# Patient Record
Sex: Male | Born: 1985 | Race: White | Hispanic: No | Marital: Single | Smoking: Current every day smoker
Health system: Southern US, Community
[De-identification: ages and names within clinical notes are randomized; demographics above are authoritative.]

## PROBLEM LIST (undated history)

## (undated) DIAGNOSIS — F319 Bipolar disorder, unspecified: Secondary | ICD-10-CM

## (undated) DIAGNOSIS — S069X9A Unspecified intracranial injury with loss of consciousness of unspecified duration, initial encounter: Secondary | ICD-10-CM

## (undated) DIAGNOSIS — J9819 Other pulmonary collapse: Secondary | ICD-10-CM

## (undated) DIAGNOSIS — F410 Panic disorder [episodic paroxysmal anxiety] without agoraphobia: Secondary | ICD-10-CM

## (undated) DIAGNOSIS — S069XAA Unspecified intracranial injury with loss of consciousness status unknown, initial encounter: Secondary | ICD-10-CM

## (undated) HISTORY — PX: INNER EAR SURGERY: SHX679

## (undated) HISTORY — DX: Bipolar disorder, unspecified: F31.9

## (undated) HISTORY — PX: TOTAL HIP ARTHROPLASTY: SHX124

## (undated) HISTORY — DX: Other pulmonary collapse: J98.19

---
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
## 2007-02-10 ENCOUNTER — Emergency Department (HOSPITAL_COMMUNITY): Admission: EM | Admit: 2007-02-10 | Discharge: 2007-02-10 | Payer: Self-pay | Admitting: Family Medicine

## 2007-08-19 ENCOUNTER — Emergency Department (HOSPITAL_COMMUNITY): Admission: EM | Admit: 2007-08-19 | Discharge: 2007-08-19 | Payer: Self-pay | Admitting: Family Medicine

## 2007-09-14 ENCOUNTER — Emergency Department (HOSPITAL_COMMUNITY): Admission: EM | Admit: 2007-09-14 | Discharge: 2007-09-14 | Payer: Self-pay | Admitting: Family Medicine

## 2007-09-16 ENCOUNTER — Emergency Department (HOSPITAL_COMMUNITY): Admission: EM | Admit: 2007-09-16 | Discharge: 2007-09-16 | Payer: Self-pay | Admitting: Family Medicine

## 2007-11-29 ENCOUNTER — Ambulatory Visit: Payer: Self-pay | Admitting: Internal Medicine
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
## 2011-03-13 NOTE — Op Note (Signed)
   NAME:  [place], [place] E                           ACCOUNT NO.:  [id number]   MEDICAL RECORD NO.:  [id number]                   PATIENT TYPE:  INP   LOCATION:  3111                                 FACILITY:  [facility]   PHYSICIAN:  Loreta Ave, M.D.              DATE OF BIRTH:  1986/08/19   DATE OF PROCEDURE:  12/05/2002  DATE OF DISCHARGE:                                 OPERATIVE REPORT   PREOPERATIVE DIAGNOSIS:  Closed posterior dislocation, traumatic, right hip.   POSTOPERATIVE DIAGNOSIS:  Closed posterior dislocation, traumatic, right  hip.   PROCEDURE:  Closed reduction of right hip dislocation under general  anesthesia.   SURGEON:  Loreta Ave, M.D.   ANESTHESIA:  General.   DESCRIPTION OF PROCEDURE:  Patient brought to the operating room and after  adequate anesthesia had been obtained, the right hip was examined.  Shortening, flexion, and internal rotation.  With flexion, longitudinal  traction, and external rotation the hip was able to be reduced to a nice  stable congruent position without difficulty.  This slid into place very  comfortably without any crepitus.  At completion I had good stability,  reasonable smooth motion.  Leg lengths were equal with normal rotation.  C-  arm views were obtained showing congruent reduction on both AP and lateral  view.  Buck's traction and an abduction pillow placed.  Anesthesia reversed.  Brought to the recovery room.  Tolerated the surgery well with no  complications.                                               Loreta Ave, M.D.    DFM/MEDQ  D:  12/06/2002  T:  12/06/2002  Job:  [id number]
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
## 2011-03-13 NOTE — Op Note (Signed)
NAME:  Allen [name], Allen [name]                 ACCOUNT NO.:  [id number]   MEDICAL RECORD NO.:  [id number]          PATIENT TYPE:  AMB   LOCATION:  DSC                          FACILITY:  [facility]   PHYSICIAN:  Kathy Breach, M.D.      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   POSTOPERATIVE DIAGNOSIS:  Ossicular discontinuity, left ear.   OPERATION PERFORMED:  Exploratory tympanotomy with autogenous bone chip  ossicular reconstruction.   SURGEON:  Kathy Breach, M.D.   ANESTHESIA:  General orotracheal anesthesia.   DESCRIPTION OF PROCEDURE:  With the patient under general orotracheal  anesthesia, the left ear was prepped and draped in sterile fashion.  The  canal was infiltrated with 1% Xylocaine, 1:100,000 epinephrine for  vasoconstriction.  After seven to eight minutes by the clock, tympanotomy  flap was incised from 6 o'clock inferiorly and 12 o'clock superiorly.  Inspection of the tympanic membrane prior to incision revealed what was seen  in the office with interior 2 to 3 mm of the long process of the malleus was  unattached from the remainder of the long process of the malleus.  Tympanotomy flap was elevated and entering the middle ear space.  The course  of the chorda tympani identified a little bit more inferiorly than would  have been expected.  As it was dissected, it dissected out into the tympanic  membrane and disappeared.  It  was disrupted probably from the trauma.  Inspection of the ossicular chain had adhesion from the other side of the  tympanic membrane down to the lenticular process area.  This was released.  Manipulation of the remaining intact upper malleus handle showed good  movement of the incus but there was no movement of the stapes with  lenticular process area fractured  and fibrotic.  This was dissected clear  leaving long process of the incus hanging down to just over the stapes  capitulum with intact stapedius tendon and a remaining gap.  Bone chips were  taken by curette from the posterior superior canal wall.  Bone chip was  positioned on the stapes capitulum with contact with the remaining long  process of the incus and appeared stable in position.  Gelfoam pledgets  soaked with Ciprodex were packed circumferentially around the bone chip  position for stabilization.  Tympanotomy flap was turned back into position  and stabilized packing the canal with Gelfoam pledgets soaked with Ciprodex.  Sterile cotton was placed in the external meatus.  The patient tolerated the  procedure well and was taken to the recovery room in stable general  condition.       JGL/MEDQ  D:  07/21/2004  T:  07/21/2004  Job:  [id number]

## 2011-08-04 LAB — INFLUENZA A AND B ANTIGEN (CONVERTED LAB): Inflenza A Ag: NEGATIVE

## 2016-04-14 ENCOUNTER — Emergency Department (HOSPITAL_COMMUNITY)
Admission: EM | Admit: 2016-04-14 | Discharge: 2016-04-14 | Disposition: A | Discharge status: Home / Self Care | Payer: Self-pay | Attending: Emergency Medicine | Admitting: Emergency Medicine

## 2016-04-14 ENCOUNTER — Emergency Department (HOSPITAL_COMMUNITY): Payer: Self-pay

## 2016-04-14 ENCOUNTER — Encounter (HOSPITAL_COMMUNITY): Payer: Self-pay

## 2016-04-14 ENCOUNTER — Other Ambulatory Visit: Payer: Self-pay

## 2016-04-14 DIAGNOSIS — R0789 Other chest pain: Secondary | ICD-10-CM | POA: Insufficient documentation

## 2016-04-14 DIAGNOSIS — Z566 Other physical and mental strain related to work: Secondary | ICD-10-CM

## 2016-04-14 DIAGNOSIS — F1290 Cannabis use, unspecified, uncomplicated: Secondary | ICD-10-CM | POA: Insufficient documentation

## 2016-04-14 DIAGNOSIS — F419 Anxiety disorder, unspecified: Secondary | ICD-10-CM | POA: Insufficient documentation

## 2016-04-14 DIAGNOSIS — F17200 Nicotine dependence, unspecified, uncomplicated: Secondary | ICD-10-CM | POA: Insufficient documentation

## 2016-04-14 DIAGNOSIS — M47814 Spondylosis without myelopathy or radiculopathy, thoracic region: Secondary | ICD-10-CM | POA: Insufficient documentation

## 2016-04-14 DIAGNOSIS — E876 Hypokalemia: Secondary | ICD-10-CM | POA: Insufficient documentation

## 2016-04-14 DIAGNOSIS — Z563 Stressful work schedule: Secondary | ICD-10-CM | POA: Insufficient documentation

## 2016-04-14 DIAGNOSIS — Z791 Long term (current) use of non-steroidal anti-inflammatories (NSAID): Secondary | ICD-10-CM | POA: Insufficient documentation

## 2016-04-14 HISTORY — DX: Panic disorder (episodic paroxysmal anxiety): F41.0

## 2016-04-14 HISTORY — DX: Unspecified intracranial injury with loss of consciousness status unknown, initial encounter: S06.9XAA

## 2016-04-14 HISTORY — DX: Unspecified intracranial injury with loss of consciousness of unspecified duration, initial encounter: S06.9X9A

## 2016-04-14 LAB — CBC
HCT: 45.6 % (ref 39.0–52.0)
HEMOGLOBIN: 16 g/dL (ref 13.0–17.0)
MCH: 32.6 pg (ref 26.0–34.0)
MCHC: 35.1 g/dL (ref 30.0–36.0)
MCV: 92.9 fL (ref 78.0–100.0)
PLATELETS: 385 10*3/uL (ref 150–400)
RBC: 4.91 MIL/uL (ref 4.22–5.81)
RDW: 13.1 % (ref 11.5–15.5)
WBC: 9.2 10*3/uL (ref 4.0–10.5)

## 2016-04-14 LAB — BASIC METABOLIC PANEL
ANION GAP: 8 (ref 5–15)
BUN: 8 mg/dL (ref 6–20)
CALCIUM: 9.2 mg/dL (ref 8.9–10.3)
CHLORIDE: 105 mmol/L (ref 101–111)
CO2: 28 mmol/L (ref 22–32)
Creatinine, Ser: 0.74 mg/dL (ref 0.61–1.24)
GFR calc non Af Amer: >60 mL/min (ref >60)
Glucose, Bld: 92 mg/dL (ref 65–99)
Potassium: 3.3 mmol/L — ABNORMAL LOW (ref 3.5–5.1)
SODIUM: 141 mmol/L (ref 135–145)

## 2016-04-14 LAB — URINALYSIS, ROUTINE W REFLEX MICROSCOPIC
BILIRUBIN URINE: NEGATIVE
Glucose, UA: NEGATIVE mg/dL
HGB URINE DIPSTICK: NEGATIVE
Ketones, ur: NEGATIVE mg/dL
Leukocytes, UA: NEGATIVE
NITRITE: NEGATIVE
PROTEIN: NEGATIVE mg/dL
SPECIFIC GRAVITY, URINE: 1.007 (ref 1.005–1.030)
pH: 6.5 (ref 5.0–8.0)

## 2016-04-14 LAB — I-STAT TROPONIN, ED: TROPONIN I, POC: 0 ng/mL (ref 0.00–0.08)

## 2016-04-14 LAB — CK: CK TOTAL: 112 U/L (ref 49–397)

## 2016-04-14 MED ORDER — SODIUM CHLORIDE 0.9 % IV BOLUS (SEPSIS)
1000.0000 mL | Freq: Once | INTRAVENOUS | Status: AC
  Administered 2016-04-14: 1000 mL via INTRAVENOUS

## 2016-04-14 MED ORDER — POTASSIUM CHLORIDE CRYS ER 20 MEQ PO TBCR
40.0000 meq | EXTENDED_RELEASE_TABLET | Freq: Once | ORAL | Status: AC
  Administered 2016-04-14: 40 meq via ORAL
  Filled 2016-04-14: qty 2

## 2016-04-14 NOTE — Discharge Instructions (Signed)
Your labs, xray, and EKG are all reassuring, your chest pain could be a variety of things including muscle pain, gas pain, and other nonemergent issues. Alternate between tylenol and motrin as directed as needed for pain. Stay well hydrated. You may consider using heat to the areas of pain, no more than 20 minutes every hour. Your potassium level was slightly low, you were given potassium here so no further treatment should be needed, you should look at the list of foods below to see if there are any foods you could eat to help supplement your potassium intake every day. Follow up with [facility] in 1-2 weeks to establish care and for recheck of symptoms, and ongoing management of your anxiety/stress. Return to the ER for changes or worsening symptoms. ° °SEEK IMMEDIATE MEDICAL ATTENTION IF: °You develop a fever.  °Your chest pains become severe or intolerable.  °You develop new, unexplained symptoms (problems).  °You develop shortness of breath, nausea, vomiting, sweating or feel light headed.  °You develop a new cough or you cough up blood. °You develop new leg swelling ° ° °Chest Wall Pain °Chest wall pain is pain in or around the bones and muscles of your chest. Sometimes, an injury causes this pain. Sometimes, the cause may not be known. This pain may take several weeks or longer to get better. °HOME CARE °Pay attention to any changes in your symptoms. Take these actions to help with your pain: °· Rest as told by your doctor. °· Avoid activities that cause pain. Try not to use your chest, belly (abdominal), or side muscles to lift heavy things. °· If directed, apply ice to the painful area: °· Put ice in a plastic bag. °· Place a towel between your skin and the bag. °· Leave the ice on for 20 minutes, 2-3 times per day. °· Take over-the-counter and prescription medicines only as told by your doctor. °· Do not use tobacco products, including cigarettes, chewing tobacco, and e-cigarettes. If you  need help quitting, ask your doctor. °· Keep all follow-up visits as told by your doctor. This is important. °GET HELP IF: °· You have a fever. °· Your chest pain gets worse. °· You have new symptoms. °GET HELP RIGHT AWAY IF: °· You feel sick to your stomach (nauseous) or you throw up (vomit). °· You feel sweaty or light-headed. °· You have a cough with phlegm (sputum) or you cough up blood. °· You are short of breath. °  °This information is not intended to replace advice given to you by your health care provider. Make sure you discuss any questions you have with your health care provider. °  °Document Released: 03/30/2008 Document Revised: 07/03/2015 Document Reviewed: 01/07/2015 °Elsevier Interactive Patient Education ©2016 [employer] Inc[employer] ° °Hypokalemia °Hypokalemia means that the amount of potassium in the blood is lower than normal. Potassium is a chemical, called an electrolyte, that helps regulate the amount of fluid in the body. It also stimulates muscle contraction and helps nerves function properly. Most of the body's potassium is inside of cells, and only a very small amount is in the blood. Because the amount in the blood is so small, minor changes can be life-threatening. °CAUSES °· Antibiotics. °· Diarrhea or vomiting. °· Using laxatives too much, which can cause diarrhea. °· Chronic kidney disease. °· Water pills (diuretics). °· Eating disorders (bulimia). °· Low magnesium level. °· Sweating a lot. °SIGNS AND SYMPTOMS °· Weakness. °· Constipation. °· Fatigue. °· Muscle cramps. °· Mental confusion. °·   Skipped heartbeats or irregular heartbeat (palpitations). °· Tingling or numbness. °DIAGNOSIS  °Your health care provider can diagnose hypokalemia with blood tests. In addition to checking your potassium level, your health care provider may also check other lab tests. °TREATMENT °Hypokalemia can be treated with potassium supplements taken by mouth or adjustments in your current medicines. If your potassium  level is very low, you may need to get potassium through a vein (IV) and be monitored in the hospital. A diet high in potassium is also helpful. Foods high in potassium are: °· Nuts, such as peanuts and pistachios. °· Seeds, such as sunflower seeds and pumpkin seeds. °· Peas, lentils, and lima beans. °· Whole grain and bran cereals and breads. °· Fresh fruit and vegetables, such as apricots, avocado, bananas, cantaloupe, kiwi, oranges, tomatoes, asparagus, and potatoes. °· Orange and tomato juices. °· Red meats. °· Fruit yogurt. °HOME CARE INSTRUCTIONS °· Take all medicines as prescribed by your health care provider. °· Maintain a healthy diet by including nutritious food, such as fruits, vegetables, nuts, whole grains, and lean meats. °· If you are taking a laxative, be sure to follow the directions on the label. °SEEK MEDICAL CARE IF: °· Your weakness gets worse. °· You feel your heart pounding or racing. °· You are vomiting or having diarrhea. °· You are diabetic and having trouble keeping your blood glucose in the normal range. °SEEK IMMEDIATE MEDICAL CARE IF: °· You have chest pain, shortness of breath, or dizziness. °· You are vomiting or having diarrhea for more than 2 days. °· You faint. °MAKE SURE YOU:  °· Understand these instructions. °· Will watch your condition. °· Will get help right away if you are not doing well or get worse. °  °This information is not intended to replace advice given to you by your health care provider. Make sure you discuss any questions you have with your health care provider. °  °Document Released: 10/12/2005 Document Revised: 11/02/2014 Document Reviewed: 04/14/2013 °Elsevier Interactive Patient Education ©2016 Else[employer] Inc. ° °Potassium Content of Foods °Potassium is a mineral found in many foods and drinks. It helps keep fluids and minerals balanced in your body and affects how steadily your heart beats. Potassium also helps control your blood pressure and keep your muscles  and nervous system healthy. °Certain health conditions and medicines may change the balance of potassium in your body. When this happens, you can help balance your level of potassium through the foods that you do or do not eat. Your health care provider or dietitian may recommend an amount of potassium that you should have each day. The following lists of foods provide the amount of potassium (in parentheses) per serving in each item. °HIGH IN POTASSIUM  °The following foods and beverages have 200 mg or more of potassium per serving: °· Apricots, 2 raw or 5 dry (200 mg). °· Artichoke, 1 medium (345 mg). °· Avocado, raw,  ¼ each (245 mg). °· Banana, 1 medium (425 mg). °· Beans, lima, or baked beans, canned, ½ cup (280 mg). °· Beans, white, canned, ½ cup (595 mg). °· Beef roast, 3 oz (320 mg). °· Beef, ground, 3 oz (270 mg). °· Beets, raw or cooked, ½ cup (260 mg). °· Bran muffin, 2 oz (300 mg). °· Broccoli, ½ cup (230 mg). °· Brussels sprouts, ½ cup (250 mg). °· Cantaloupe, ½ cup (215 mg). °· Cereal, 100% bran, ½ cup (200-400 mg). °· Cheeseburger, single, fast food, 1 each (225-400 mg). °· Chicken, 3 oz (220   mg).  Clams, canned, 3 oz (535 mg).  Crab, 3 oz (225 mg).  Dates, 5 each (270 mg).  Dried beans and peas,  cup (300-475 mg).  Figs, dried, 2 each (260 mg).  Fish: halibut, tuna, cod, snapper, 3 oz (480 mg).  Fish: salmon, haddock, swordfish, perch, 3 oz (300 mg).  Fish, tuna, canned 3 oz (200 mg).  [place] fries, fast food, 3 oz (470 mg).  Granola with fruit and nuts,  cup (200 mg).  Grapefruit juice,  cup (200 mg).  Greens, beet,  cup (655 mg).  Honeydew melon,  cup (200 mg).  Kale, raw, 1 cup (300 mg).  Kiwi, 1 medium (240 mg).  Kohlrabi, rutabaga, parsnips,  cup (280 mg).  Lentils,  cup (365 mg).  Mango, 1 each (325 mg).  Milk, chocolate, 1 cup (420 mg).  Milk: nonfat, low-fat, whole, buttermilk, 1 cup (350-380 mg).  Molasses, 1 Tbsp (295 mg).  Mushrooms,  cup  (280) mg.  Nectarine, 1 each (275 mg).  Nuts: almonds, peanuts, hazelnuts, [place], cashew, mixed, 1 oz (200 mg).  Nuts, pistachios, 1 oz (295 mg).  Orange, 1 each (240 mg).  Orange juice,  cup (235 mg).  Papaya, medium,  fruit (390 mg).  Peanut butter, chunky, 2 Tbsp (240 mg).  Peanut butter, smooth, 2 Tbsp (210 mg).  Pear, 1 medium (200 mg).  Pomegranate, 1 whole (400 mg).  Pomegranate juice,  cup (215 mg).  Pork, 3 oz (350 mg).  Potato chips, salted, 1 oz (465 mg).  Potato, baked with skin, 1 medium (925 mg).  Potatoes, boiled,  cup (255 mg).  Potatoes, mashed,  cup (330 mg).  Prune juice,  cup (370 mg).  Prunes, 5 each (305 mg).  Pudding, chocolate,  cup (230 mg).  Pumpkin, canned,  cup (250 mg).  Raisins, seedless,  cup (270 mg).  Seeds, sunflower or pumpkin, 1 oz (240 mg).  Soy milk, 1 cup (300 mg).  Spinach,  cup (420 mg).  Spinach, canned,  cup (370 mg).  Sweet potato, baked with skin, 1 medium (450 mg).  Swiss chard,  cup (480 mg).  Tomato or vegetable juice,  cup (275 mg).  Tomato sauce or puree,  cup (400-550 mg).  Tomato, raw, 1 medium (290 mg).  Tomatoes, canned,  cup (200-300 mg).  [place], 3 oz (250 mg).  Wheat germ, 1 oz (250 mg).  Winter squash,  cup (250 mg).  Yogurt, plain or fruited, 6 oz (260-435 mg).  Zucchini,  cup (220 mg). MODERATE IN POTASSIUM The following foods and beverages have 50-200 mg of potassium per serving:  Apple, 1 each (150 mg).  Apple juice,  cup (150 mg).  Applesauce,  cup (90 mg).  Apricot nectar,  cup (140 mg).  Asparagus, small spears,  cup or 6 spears (155 mg).  Bagel, cinnamon raisin, 1 each (130 mg).  Bagel, egg or plain, 4 in., 1 each (70 mg).  Beans, green,  cup (90 mg).  Beans, yellow,  cup (190 mg).  Beer, regular, 12 oz (100 mg).  Beets, canned,  cup (125 mg).  Blackberries,  cup (115 mg).  Blueberries,  cup (60 mg).  Bread, whole wheat, 1  slice (70 mg).  Broccoli, raw,  cup (145 mg).  Cabbage,  cup (150 mg).  Carrots, cooked or raw,  cup (180 mg).  Cauliflower, raw,  cup (150 mg).  Celery, raw,  cup (155 mg).  Cereal, bran flakes, cup (120-150 mg).  Cheese, cottage,  cup (110  mg).  Cherries, 10 each (150 mg).  Chocolate, 1 oz bar (165 mg).  Coffee, brewed 6 oz (90 mg).  Corn,  cup or 1 ear (195 mg).  Cucumbers,  cup (80 mg).  Egg, large, 1 each (60 mg).  Eggplant,  cup (60 mg).  Endive, raw, cup (80 mg).  English muffin, 1 each (65 mg).  Fish, orange roughy, 3 oz (150 mg).  Frankfurter, beef or pork, 1 each (75 mg).  Fruit cocktail,  cup (115 mg).  Grape juice,  cup (170 mg).  Grapefruit,  fruit (175 mg).  Grapes,  cup (155 mg).  Greens: kale, turnip, collard,  cup (110-150 mg).  Ice cream or frozen yogurt, chocolate,  cup (175 mg).  Ice cream or frozen yogurt, vanilla,  cup (120-150 mg).  Lemons, limes, 1 each (80 mg).  Lettuce, all types, 1 cup (100 mg).  Mixed vegetables,  cup (150 mg).  Mushrooms, raw,  cup (110 mg).  Nuts: walnuts, pecans, or macadamia, 1 oz (125 mg).  Oatmeal,  cup (80 mg).  Okra,  cup (110 mg).  Onions, raw,  cup (120 mg).  Peach, 1 each (185 mg).  Peaches, canned,  cup (120 mg).  Pears, canned,  cup (120 mg).  Peas, green, frozen,  cup (90 mg).  Peppers, green,  cup (130 mg).  Peppers, red,  cup (160 mg).  Pineapple juice,  cup (165 mg).  Pineapple, fresh or canned,  cup (100 mg).  Plums, 1 each (105 mg).  Pudding, vanilla,  cup (150 mg).  Raspberries,  cup (90 mg).  Rhubarb,  cup (115 mg).  Rice, wild,  cup (80 mg).  Shrimp, 3 oz (155 mg).  Spinach, raw, 1 cup (170 mg).  Strawberries,  cup (125 mg).  Summer squash  cup (175-200 mg).  Swiss chard, raw, 1 cup (135 mg).  Tangerines, 1 each (140 mg).  Tea, brewed, 6 oz (65 mg).  Turnips,  cup (140 mg).  Watermelon,  cup (85  mg).  Wine, red, table, 5 oz (180 mg).  Wine, white, table, 5 oz (100 mg). LOW IN POTASSIUM The following foods and beverages have less than 50 mg of potassium per serving.  Bread, white, 1 slice (30 mg).  Carbonated beverages, 12 oz (less than 5 mg).  Cheese, 1 oz (20-30 mg).  Cranberries,  cup (45 mg).  Cranberry juice cocktail,  cup (20 mg).  Fats and oils, 1 Tbsp (less than 5 mg).  Hummus, 1 Tbsp (32 mg).  Nectar: papaya, mango, or pear,  cup (35 mg).  Rice, white or brown,  cup (50 mg).  Spaghetti or macaroni,  cup cooked (30 mg).  Tortilla, flour or corn, 1 each (50 mg).  Waffle, 4 in., 1 each (50 mg).  Water chestnuts,  cup (40 mg).   This information is not intended to replace advice given to you by your health care provider. Make sure you discuss any questions you have with your health care provider.   Document Released: 05/26/2005 Document Revised: 10/17/2013 Document Reviewed: 09/08/2013 Elsevier Interactive Patient Education 2016 [place] and Stress Management Stress is a normal reaction to life events. It is what you feel when life demands more than you are used to or more than you can handle. Some stress can be useful. For example, the stress reaction can help you catch the last bus of the day, study for a test, or meet a deadline at work. But stress that occurs too often or  for too long can cause problems. It can affect your emotional health and interfere with relationships and normal daily activities. Too much stress can weaken your immune system and increase your risk for physical illness. If you already have a medical problem, stress can make it worse. °CAUSES  °All sorts of life events may cause stress. An event that causes stress for one person may not be stressful for another person. Major life events commonly cause stress. These may be positive or negative. Examples include losing your job, moving into a new home, getting married,  having a baby, or losing a loved one. Less obvious life events may also cause stress, especially if they occur day after day or in combination. Examples include working long hours, driving in traffic, caring for children, being in debt, or being in a difficult relationship. °SIGNS AND SYMPTOMS °Stress may cause emotional symptoms including, the following: °· Anxiety. This is feeling worried, afraid, on edge, overwhelmed, or out of control. °· Anger. This is feeling irritated or impatient. °· Depression. This is feeling sad, down, helpless, or guilty. °· Difficulty focusing, remembering, or making decisions. °Stress may cause physical symptoms, including the following:  °· Aches and pains. These may affect your head, neck, back, stomach, or other areas of your body. °· Tight muscles or clenched jaw. °· Low energy or trouble sleeping.  °Stress may cause unhealthy behaviors, including the following:  °· Eating to feel better (overeating) or skipping meals. °· Sleeping too little, too much, or both. °· Working too much or putting off tasks (procrastination). °· Smoking, drinking alcohol, or using drugs to feel better. °DIAGNOSIS  °Stress is diagnosed through an assessment by your health care provider. Your health care provider will ask questions about your symptoms and any stressful life events. Your health care provider will also ask about your medical history and may order blood tests or other tests. Certain medical conditions and medicine can cause physical symptoms similar to stress.  Mental illness can cause emotional symptoms and unhealthy behaviors similar to stress. Your health care provider may refer you to a mental health professional for further evaluation.  °TREATMENT  °Stress management is the recommended treatment for stress. The goals of stress management are reducing stressful life events and coping with stress in healthy ways.  °Techniques for reducing stressful life events include the  following: °· Stress identification. Self-monitor for stress and identify what causes stress for you. These skills may help you to avoid some stressful events. °· Time management. Set your priorities, keep a calendar of events, and learn to say "no." These tools can help you avoid making too many commitments. °Techniques for coping with stress include the following: °· Rethinking the problem. Try to think realistically about stressful events rather than ignoring them or overreacting. Try to find the positives in a stressful situation rather than focusing on the negatives. °· Exercise. Physical exercise can release both physical and emotional tension. The key is to find a form of exercise you enjoy and do it regularly. °· Relaxation techniques. These relax the body and mind. Examples include yoga, meditation, tai chi, biofeedback, deep breathing, progressive muscle relaxation, listening to music, being out in nature, journaling, and other hobbies. Again, the key is to find one or more that you enjoy and can do regularly. °· Healthy lifestyle. Eat a balanced diet, get plenty of sleep, and do not smoke. Avoid using alcohol or drugs to relax. °· Strong support network. Spend time with family, friends, or other people you enjoy being   around. Express your feelings and talk things over with someone you trust. °Counseling or talk therapy with a mental health professional may be helpful if you are having difficulty managing stress on your own. Medicine is typically not recommended for the treatment of stress. Talk to your health care provider if you think you need medicine for symptoms of stress. °HOME CARE INSTRUCTIONS °· Keep all follow-up visits as directed by your health care provider. °· Take all medicines as directed by your health care provider. °SEEK MEDICAL CARE IF: °· Your symptoms get worse or you start having new symptoms. °· You feel overwhelmed by your problems and can no longer manage them on your own. °SEEK  IMMEDIATE MEDICAL CARE IF: °· You feel like hurting yourself or someone else. °  °This information is not intended to replace advice given to you by your health care provider. Make sure you discuss any questions you have with your health care provider. °  °Document Released: 04/07/2001 Document Revised: 11/02/2014 Document Reviewed: 06/06/2013 °Elsevier Interactive Patient Education ©2016 Else[employer] Inc. ° °

## 2016-04-14 NOTE — Progress Notes (Signed)
Entered in d/c instructions Please use the resources provided to you in emergency room by case manager to assist you're your choice of doctor for follow up Call As needed These [place] uninsured resources provide possible primary care providers, resources for discounted medications, and [place]

## 2016-04-14 NOTE — ED Notes (Signed)
Patient transported to X-ray 

## 2016-04-14 NOTE — ED Notes (Signed)
Allen [name] in lab stated should would run the CK.  Patient is aware I need urine

## 2016-04-14 NOTE — ED Provider Notes (Signed)
CSN: [id number]     Arrival date & time 04/14/16  1157 History   First MD Initiated Contact with Patient 04/14/16 1225     Chief Complaint  Patient presents with  . Chest Pain     (Consider location/radiation/quality/duration/timing/severity/associated sxs/prior Treatment) HPI Comments: Allen [name] is a 30 y.o. male with a PMHx of TBI and anxiety attacks, who presents to the ED with complaints of gradual onset intermittent chest pain 1-2 months. 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 He reports that he is having intermittent anterior chest wall pain that is currently resolved but he was concerned because he does not have a primary care doctor and wanted to be evaluated for this chest pain. He describes the pain as 4/10 stabbing and pulling along the anterior chest wall, intermittent, nonradiating, worse with activity and movement, and improved with Tylenol arthritis and ibuprofen. Associated symptoms include anxiety/stress. Positive family history of MI in his father at age 23 years old which killed him.   He denies any fevers, chills, shortness of breath, lightheadedness, diaphoresis, leg swelling, recent travel/surgery/immobilization, family or personal history of DVT/PE, cough, abdominal pain, nausea vomiting, diarrhea, constipation, dysuria, hematuria, numbness, tingling, weakness, claudication, or orthopnea. He is a smoker.  Patient is a 30 y.o. male presenting with chest pain. The history is provided by the patient and medical records. No language interpreter was used.  Chest Pain Pain location:  L chest and R chest Pain quality: stabbing   Pain quality comment:  And pulling Pain radiates to:  Does not radiate Pain radiates to the back: no   Pain severity:  Moderate Onset quality:  Gradual Duration:  2  months Timing:  Intermittent Progression:  Partially resolved Chronicity:  New Context: at rest and stress   Relieved by: tylenol and motrin. Worsened by:  Movement Ineffective treatments:  None tried Associated symptoms: no abdominal pain, no claudication, no cough, no diaphoresis, no fever, no lower extremity edema, no nausea, no numbness, no orthopnea, no shortness of breath, not vomiting and no weakness   Risk factors: male sex and smoking   Risk factors: no coronary artery disease, no diabetes mellitus, no high cholesterol, no hypertension, no immobilization, no prior DVT/PE and no surgery     Past Medical History  Diagnosis Date  . Traumatic brain injury (HCC)   . Panic attacks    Past Surgical History  Procedure Laterality Date  . Total hip arthroplasty     History reviewed. No pertinent family history. Social History  Substance Use Topics  . Smoking status: Current Every Day Smoker  . Smokeless tobacco: None  . Alcohol Use: Yes     Comment: social    Review of Systems  Constitutional: Negative for fever, chills and diaphoresis.  Respiratory: Negative for cough and shortness of breath.   Cardiovascular: Positive for chest pain. Negative for orthopnea, claudication and leg swelling.  Gastrointestinal: Negative for nausea, vomiting, abdominal pain, diarrhea and constipation.  Genitourinary: Negative for dysuria and hematuria.  Musculoskeletal: Negative for myalgias and arthralgias.  Skin: Negative for color change.  Allergic/Immunologic: Negative for immunocompromised state.  Neurological: Negative for weakness, light-headedness and numbness.  Psychiatric/Behavioral: Negative for confusion. The patient is nervous/anxious.    10 Systems reviewed and are negative for acute change except as noted in the HPI.  Allergies  Review of patient's allergies indicates no known allergies.  Home Medications   Prior to Admission medications   Not on File   BP 141/93 mmHg   Pulse 86  Temp(Src) 98.2 F (36.8 C) (Oral)  Resp 22  SpO2 100% Physical Exam  Constitutional: He is oriented to person, place, and time. Vital signs are normal. He appears well-developed and well-nourished.  Non-toxic appearance. No distress.  Afebrile, nontoxic, NAD  HENT:  Head: Normocephalic and atraumatic.  Mouth/Throat: Oropharynx is clear and moist and mucous membranes are normal.  Eyes: Conjunctivae and EOM are normal. Right eye exhibits no discharge. Left eye exhibits no discharge.  Neck: Normal range of motion. Neck supple.  Cardiovascular: Normal rate, regular rhythm, normal heart sounds and intact distal pulses.  Exam reveals no gallop and no friction rub.   No murmur heard. RRR, nl s1/s2, no m/r/g, distal pulses intact, no pedal edema   Pulmonary/Chest: Effort normal and breath sounds normal. No respiratory distress. He has no decreased breath sounds. He has no wheezes. He has no rhonchi. He has no rales. He exhibits tenderness. He exhibits no crepitus, no deformity and no retraction.    CTAB in all lung fields, no w/r/r, no hypoxia or increased WOB, speaking in full sentences, SpO2 100% on RA Chest wall with mild anterior chest wall TTP without crepitus, deformities, or retractions   Abdominal: Soft. Normal appearance and bowel sounds are normal. He exhibits no distension. There is no tenderness. There is no rigidity, no rebound, no guarding, no CVA tenderness, no tenderness at McBurney's point and negative Murphy's sign.  Musculoskeletal: Normal range of motion.  MAE x4 Strength and sensation grossly intact Distal pulses intact Gait steady No pedal edema, neg homan's bilaterally   Neurological: He is alert and oriented to person, place, and time. He has normal strength. No sensory deficit.  Skin: Skin is warm, dry and intact. No rash noted.  Psychiatric: His mood appears anxious.  Somewhat anxious during conversation  Nursing note and vitals reviewed.   ED Course   Procedures (including critical care time) Labs Review Labs Reviewed  BASIC METABOLIC PANEL - Abnormal; Notable for the following:    Potassium 3.3 (*)    All other components within normal limits  URINALYSIS, ROUTINE W REFLEX MICROSCOPIC (NOT AT [facility]) - Abnormal; Notable for the following:    APPearance CLOUDY (*)    All other components within normal limits  CBC  CK  I-STAT TROPOININ, ED    Imaging Review Dg Chest 2 View  04/14/2016  CLINICAL DATA:  Chest pain for 2 months. EXAM: CHEST  2 VIEW COMPARISON:  None available at the time of this dictation. FINDINGS: Cardiomediastinal silhouette is normal. Mediastinal contours appear intact. There is no evidence of focal airspace consolidation, pleural effusion or pneumothorax. Osseous structures are without acute abnormality. Osteoarthritic changes of the thoracic spine and T8-T9 with formation of large anterior osteophyte are noted. Soft tissues are grossly normal. IMPRESSION: No active cardiopulmonary disease. 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 Electronically Signed   By: Ted Mcalpineobrinka  Dimitrova M.D.   On: 04/14/2016 12:53   I have personally reviewed and evaluated these images and lab results as part of my medical decision-making.   EKG Interpretation   Date/Time:  Tuesday April 14 2016 12:07:05 EDT Ventricular Rate:  74 PR Interval:    QRS Duration: 107 QT Interval:  373 QTC Calculation: 414 R Axis:   87 Text Interpretation:  Sinus rhythm RSR' in V1 or V2, probably  normal  variant ST elev, probable normal early repol pattern No significant change  since last tracing Confirmed by [facility]  MD, DONALD ([id number]) on 04/14/2016  12:33:58 PM      MDM   Final diagnoses:  Chest wall pain  Stress at work  Anxiety  Hypokalemia  Degenerative arthritis of thoracic spine    30 y.o. male here with intermittent chest pain 1-2 months, currently resolved upon arrival. Reports increased anxiety and stress due to his job, physically intensive  job which requires a lot of manual labor. States he was concerned because he does not have regular doctor and wanted to be checked out to make sure nothing was wrong. Denies shortness of breath, no tachycardia or hypoxia, no LE swelling, PERC negative, doubt need for dimer. On exam, chest pain reproducible with palpation of his anterior chest wall, which likely indicates musculoskeletal etiology; could be stress/anxiety related. Pt currently feels well, will hold off on meds at this time. Will give fluids, check labs including CK to ensure no rhabdomyolysis; will get U/A as well in case CK is elevated. Will get CXR. EKG without acute ischemic findings, no prior to compare-- some early repol noted. Will reassess after labs/imaging/fluids.   2:09 PM U/A unremarkable, trop neg, CK WNL, BMP with mildly low K at 3.3, will replete since he's having musculoskeletal symptoms. CBC WNL. CXR without acute findings aside from mild arthritic findings in T8-9. HEART score low, doubt ACS or need for admission. Pt feeling well, still not having ongoing pain, discussed that it's likely stress/anxiety/muscloskeletal, discussed stress management techniques and tylenol/motrin/heat for pain. Discussed f/up with [facility] in 1-2wks for recheck of symptoms, ongoing management of anxiety, and to establish care. I explained the diagnosis and have given explicit precautions to return to the ER including for any other new or worsening symptoms. The patient understands and accepts the medical plan as it's been dictated and I have answered their questions. Discharge instructions concerning home care and prescriptions have been given. The patient is STABLE and is discharged to home in good condition.   BP 141/93 mmHg  Pulse 86  Temp(Src) 98.2 F (36.8 C) (Oral)  Resp 22  SpO2 100%  Meds ordered this encounter  Medications  . sodium chloride 0.9 % bolus 1,000 mL    Sig:   . potassium chloride SA (K-DUR,KLOR-CON) CR tablet 40 mEq     Sig:        Jamicheal Heard Camprubi-Soms, PA-C 04/14/16 1419  Zadie Rhine, MD 04/15/16 [phone]

## 2016-04-14 NOTE — Progress Notes (Signed)
Pt and his mother at the bedside states pt does not have a pcp States pt with hx of TBI States on "wednesday" they signed up for medicaid - Pending Cm assessed [place] Cm provided information for the [place] [facility] [place] Pt id is already familiar with the clinic  Cm reviewed medication resources for the uninsured patients to include needymeds.com, goodrx.com and [place] [facility] Pt is not eligible for [facility] services [place]

## 2016-04-14 NOTE — ED Notes (Signed)
Pt with chest pain x 2 months.  Has had increase stress in job.  Has physical job.  Denies cough/congestion/GERD or injury.  Pt does states some shortness of breath at times.  Pain occurs when just sitting still

## 2016-04-27 ENCOUNTER — Encounter: Payer: Self-pay | Admitting: Physician Assistant

## 2016-04-27 ENCOUNTER — Ambulatory Visit: Payer: Self-pay | Admitting: Physician Assistant

## 2016-04-27 VITALS — BP 104/60 | HR 84 | Temp 97.9°F | Ht 69.5 in | Wt 179.5 lb

## 2016-04-27 DIAGNOSIS — Z1329 Encounter for screening for other suspected endocrine disorder: Secondary | ICD-10-CM

## 2016-04-27 DIAGNOSIS — F17210 Nicotine dependence, cigarettes, uncomplicated: Secondary | ICD-10-CM

## 2016-04-27 DIAGNOSIS — F39 Unspecified mood [affective] disorder: Secondary | ICD-10-CM

## 2016-04-27 DIAGNOSIS — Z87820 Personal history of traumatic brain injury: Secondary | ICD-10-CM

## 2016-04-27 DIAGNOSIS — Z131 Encounter for screening for diabetes mellitus: Secondary | ICD-10-CM

## 2016-04-27 DIAGNOSIS — Z13220 Encounter for screening for lipoid disorders: Secondary | ICD-10-CM

## 2016-04-27 LAB — GLUCOSE, POCT (MANUAL RESULT ENTRY): POC Glucose: 102 mg/dl — AB (ref 70–99)

## 2016-04-27 NOTE — Progress Notes (Signed)
BP 104/60 mmHg  Pulse 84  Temp(Src) 97.9 F (36.6 C)  Ht 5' 9.5" (1.765 m)  Wt 279 lb 8 oz (126.78 kg)  BMI 40.70 kg/m2  SpO2 99%   Subjective:    Patient ID: Allen [name], male    DOB: 07/14/1986, 30 y.o.   MRN: [id number]  HPI: Allen [name] is a 30 y.o. male presenting on 04/27/2016 for New Patient (Initial Visit) and Tingling   HPI   Chief Complaint  Patient presents with  . New Patient (Initial Visit)    pt has not seen provider since around 2008 at [facility] [place]  . Tingling    R arm    Pt's mother is with him today.  Pt states anxiety.  Doesn't get it at home.  Sometimes sob when he gets anxiety.  He has had no treatment for his bipolar since about 2008.  He gets tingling in his R arm when he feels anxious.  Pt hasn't been to work for past several weeks.  He was working at a golf course.  He has appointment with psychciatrist for disability determination. He has filed for [employer].  I discussed with pt that he likely doesn't need to be disabled due to his anxiety/bipolar, but he does need treatment.  He has been taking xanax that was given to him by a friend.  Discussed with [facility] that [facility]'t provide this type of medication, that it would be something he would need to get from psychiatrist.   Relevant past medical, surgical, family and social history reviewed and updated as indicated. Interim medical history since our last visit reviewed. Allergies and medications reviewed and updated.   Current outpatient prescriptions:  .  acetaminophen (TYLENOL) 650 MG CR tablet, Take 1,300-1,950 mg by mouth every 8 (eight) hours as needed for pain., Disp: , Rfl:  .  ALPRAZOLAM PO, Take 0.25 tablets by mouth 4 (four) times daily as needed., Disp: , Rfl:  .  ibuprofen (ADVIL,MOTRIN) 200 MG tablet, Take 400 mg by mouth every 6 (six) hours as needed for headache or moderate pain., Disp: , Rfl:    Review of Systems  Per HPI unless specifically indicated above      Objective:    BP 104/60 mmHg  Pulse 84  Temp(Src) 97.9 F (36.6 C)  Ht 5' 9.5" (1.765 m)  Wt 279 lb 8 oz (126.78 kg)  BMI 40.70 kg/m2  SpO2 99%  Wt Readings from Last 3 Encounters:  04/27/16 279 lb 8 oz (126.78 kg)    Physical Exam  Constitutional: He is oriented to person, place, and time. He appears well-developed and well-nourished.  HENT:  Head: Normocephalic and atraumatic.  Mouth/Throat: Oropharynx is clear and moist. No oropharyngeal exudate.  Eyes: Conjunctivae and EOM are normal. Pupils are equal, round, and reactive to light.  Neck: Neck supple. No thyromegaly present.  Cardiovascular: Normal rate and regular rhythm.   Pulmonary/Chest: Effort normal and breath sounds normal. He has no wheezes. He has no rales.  Abdominal: Soft. Bowel sounds are normal. He exhibits no mass. There is no hepatosplenomegaly. There is no tenderness.  Musculoskeletal: He exhibits no edema.  Lymphadenopathy:    He has no cervical adenopathy.  Neurological: He is alert and oriented to person, place, and time.  Skin: Skin is warm and dry. No rash noted.  Psychiatric: His behavior is normal. Thought content normal. His mood appears anxious. His speech is rapid and/or pressured.  Vitals reviewed.   Results for orders  placed or performed in visit on 04/27/16  POCT Glucose (CBG)  Result Value Ref Range   POC Glucose 102 (A) 70 - 99 mg/dl      Assessment & Plan:   Encounter Diagnoses  Name Primary?  . Mood disorder (HCC) Yes  . Cigarette nicotine dependence without complication   . History of traumatic brain injury   . Screening for diabetes mellitus   . Screening cholesterol level   . Screening for thyroid disorder       -Pt to Get fasting labs drawn tomorrow -Discussed with pt he likely doesn't need disability but treatment for his bipolar disorder.  [facility] and urged him to go to walk-in there but he says he doesn't like [facility], that he went there in the past.   Gave him card for [facility] to call for referral [place] other [place] [facility] [place] ([facility] [place]) -f/u 1 month.  RTO sooner prn

## 2016-04-28 DIAGNOSIS — F17210 Nicotine dependence, cigarettes, uncomplicated: Secondary | ICD-10-CM | POA: Insufficient documentation

## 2016-04-28 DIAGNOSIS — Z87820 Personal history of traumatic brain injury: Secondary | ICD-10-CM | POA: Insufficient documentation

## 2016-04-28 DIAGNOSIS — F39 Unspecified mood [affective] disorder: Secondary | ICD-10-CM | POA: Insufficient documentation

## 2016-04-30 LAB — HEMOGLOBIN A1C
Hgb A1c MFr Bld: 5.4 % (ref <5.7)
Mean Plasma Glucose: 108 mg/dL

## 2016-05-01 LAB — COMPLETE METABOLIC PANEL WITH GFR
ALBUMIN: 4.6 g/dL (ref 3.6–5.1)
ALK PHOS: 56 U/L (ref 40–115)
ALT: 20 U/L (ref 9–46)
AST: 15 U/L (ref 10–40)
BUN: 10 mg/dL (ref 7–25)
CALCIUM: 9.8 mg/dL (ref 8.6–10.3)
CO2: 27 mmol/L (ref 20–31)
Chloride: 102 mmol/L (ref 98–110)
Creat: 0.85 mg/dL (ref 0.60–1.35)
Glucose, Bld: 92 mg/dL (ref 65–99)
POTASSIUM: 4.4 mmol/L (ref 3.5–5.3)
Sodium: 142 mmol/L (ref 135–146)
Total Bilirubin: 1 mg/dL (ref 0.2–1.2)
Total Protein: 6.9 g/dL (ref 6.1–8.1)

## 2016-05-01 LAB — LIPID PANEL
CHOL/HDL RATIO: 3.9 ratio (ref <=5.0)
CHOLESTEROL: 185 mg/dL (ref 125–200)
HDL: 48 mg/dL (ref >=40)
LDL Cholesterol: 120 mg/dL (ref <130)
Triglycerides: 83 mg/dL (ref <150)
VLDL: 17 mg/dL (ref <30)

## 2016-05-01 LAB — TSH: TSH: 1.38 m[IU]/L (ref 0.40–4.50)

## 2016-05-26 ENCOUNTER — Ambulatory Visit: Payer: Self-pay | Admitting: Physician Assistant

## 2016-05-26 ENCOUNTER — Encounter: Payer: Self-pay | Admitting: Physician Assistant

## 2016-05-26 VITALS — BP 104/64 | HR 62 | Temp 98.1°F | Ht 69.5 in | Wt 180.6 lb

## 2016-05-26 DIAGNOSIS — F17210 Nicotine dependence, cigarettes, uncomplicated: Secondary | ICD-10-CM

## 2016-05-26 DIAGNOSIS — F39 Unspecified mood [affective] disorder: Secondary | ICD-10-CM

## 2016-05-26 DIAGNOSIS — K5900 Constipation, unspecified: Secondary | ICD-10-CM

## 2016-05-26 DIAGNOSIS — Z87820 Personal history of traumatic brain injury: Secondary | ICD-10-CM

## 2016-05-26 NOTE — Patient Instructions (Signed)
Constipation, Adult Constipation is when a person:  Poops (has a bowel movement) less than 3 times a week.  Has a hard time pooping.  Has poop that is dry, hard, or bigger than normal. HOME CARE   Eat foods with a lot of fiber in them. This includes fruits, vegetables, beans, and whole grains such as brown rice.  Avoid fatty foods and foods with a lot of sugar. This includes french fries, hamburgers, cookies, candy, and soda.  If you are not getting enough fiber from food, take products with added fiber in them (supplements).  Drink enough fluid to keep your pee (urine) clear or pale yellow.  Exercise on a regular basis, or as told by your doctor.  Go to the restroom when you feel like you need to poop. Do not hold it.  Only take medicine as told by your doctor. Do not take medicines that help you poop (laxatives) without talking to your doctor first. GET HELP RIGHT AWAY IF:   You have bright red blood in your poop (stool).  Your constipation lasts more than 4 days or gets worse.  You have belly (abdominal) or butt (rectal) pain.  You have thin poop (as thin as a pencil).  You lose weight, and it cannot be explained. MAKE SURE YOU:   Understand these instructions.  Will watch your condition.  Will get help right away if you are not doing well or get worse.   This information is not intended to replace advice given to you by your health care provider. Make sure you discuss any questions you have with your health care provider.   Document Released: 03/30/2008 Document Revised: 11/02/2014 Document Reviewed: 07/24/2013 Elsevier Interactive Patient Education 2016 Elsevier Inc.  

## 2016-05-26 NOTE — Progress Notes (Signed)
BP 104/64 (BP Location: Left Arm, Patient Position: Sitting, Cuff Size: Normal)   Pulse 62   Temp 98.1 F (36.7 C) (Other (Comment))   Ht 5' 9.5" (1.765 m)   Wt 180 lb 9.6 oz (81.9 kg)   SpO2 98%   BMI 26.29 kg/m    Subjective:    Patient ID: Allen [name], male    DOB: October 09, 1986, 30 y.o.   MRN: [id number]  HPI: [name] Scherman is a 30 y.o. male presenting on 05/26/2016 for Follow-up (has appt with [facility] tomorrow)   HPI Pt has been to [facility] already and has f/u appointment there tomorrow.  Relevant past medical, surgical, family and social history reviewed and updated as indicated. Interim medical history since our last visit reviewed. Allergies and medications reviewed and updated.   Current Outpatient Prescriptions:  .  acetaminophen (TYLENOL) 650 MG CR tablet, Take 1,300-1,950 mg by mouth every 8 (eight) hours as needed for pain., Disp: , Rfl:  .  ALPRAZOLAM PO, Take 0.25 tablets by mouth 4 (four) times daily as needed., Disp: , Rfl:  .  ibuprofen (ADVIL,MOTRIN) 200 MG tablet, Take 400 mg by mouth every 6 (six) hours as needed for headache or moderate pain., Disp: , Rfl:    Review of Systems  Constitutional: Positive for appetite change and fatigue. Negative for chills, diaphoresis, fever and unexpected weight change.  HENT: Positive for dental problem. Negative for congestion, drooling, ear pain, facial swelling, hearing loss, mouth sores, sneezing, sore throat, trouble swallowing and voice change.   Eyes: Negative for pain, discharge, redness, itching and visual disturbance.  Respiratory: Negative for cough, choking, shortness of breath and wheezing.   Cardiovascular: Negative for chest pain, palpitations and leg swelling.  Gastrointestinal: Positive for constipation. Negative for abdominal pain, blood in stool, diarrhea and vomiting.  Endocrine: Negative for cold intolerance, heat intolerance and polydipsia.  Genitourinary: Negative for decreased urine volume,  dysuria and hematuria.  Musculoskeletal: Positive for arthralgias and back pain. Negative for gait problem.  Skin: Negative for rash.  Allergic/Immunologic: Negative for environmental allergies.  Neurological: Negative for seizures, syncope, light-headedness and headaches.  Hematological: Negative for adenopathy.  Psychiatric/Behavioral: Positive for agitation and dysphoric mood. Negative for suicidal ideas. The patient is nervous/anxious.     Per HPI unless specifically indicated above     Objective:    BP 104/64 (BP Location: Left Arm, Patient Position: Sitting, Cuff Size: Normal)   Pulse 62   Temp 98.1 F (36.7 C) (Other (Comment))   Ht 5' 9.5" (1.765 m)   Wt 180 lb 9.6 oz (81.9 kg)   SpO2 98%   BMI 26.29 kg/m   Wt Readings from Last 3 Encounters:  05/26/16 180 lb 9.6 oz (81.9 kg)  04/27/16 179 lb 8 oz (81.4 kg)    Physical Exam  Constitutional: He is oriented to person, place, and time. He appears well-developed and well-nourished.  HENT:  Head: Normocephalic and atraumatic.  Neck: Neck supple.  Cardiovascular: Normal rate and regular rhythm.   Pulmonary/Chest: Effort normal and breath sounds normal. He has no wheezes.  Abdominal: Soft. Bowel sounds are normal. There is no hepatosplenomegaly. There is no tenderness.  Musculoskeletal: He exhibits no edema.  Lymphadenopathy:    He has no cervical adenopathy.  Neurological: He is alert and oriented to person, place, and time.  Skin: Skin is warm and dry.  Psychiatric: He has a normal mood and affect. His behavior is normal.  Vitals reviewed.   Results  for orders placed or performed in visit on 04/27/16  TSH  Result Value Ref Range   TSH 1.38 0.40 - 4.50 mIU/L  HgB A1c  Result Value Ref Range   Hgb A1c MFr Bld 5.4 <5.7 %   Mean Plasma Glucose 108 mg/dL  COMPLETE METABOLIC PANEL WITH GFR  Result Value Ref Range   Sodium 142 135 - 146 mmol/L   Potassium 4.4 3.5 - 5.3 mmol/L   Chloride 102 98 - 110 mmol/L   CO2  27 20 - 31 mmol/L   Glucose, Bld 92 65 - 99 mg/dL   BUN 10 7 - 25 mg/dL   Creat 6.73 4.19 - 3.79 mg/dL   Total Bilirubin 1.0 0.2 - 1.2 mg/dL   Alkaline Phosphatase 56 40 - 115 U/L   AST 15 10 - 40 U/L   ALT 20 9 - 46 U/L   Total Protein 6.9 6.1 - 8.1 g/dL   Albumin 4.6 3.6 - 5.1 g/dL   Calcium 9.8 8.6 - 02.4 mg/dL   GFR, Est African American >89 >=60 mL/min   GFR, Est Non African American >89 >=60 mL/min  Lipid Profile  Result Value Ref Range   Cholesterol 185 125 - 200 mg/dL   Triglycerides 83 <097 mg/dL   HDL 48 >=35 mg/dL   Total CHOL/HDL Ratio 3.9 <=5.0 Ratio   VLDL 17 <30 mg/dL   LDL Cholesterol 329 <924 mg/dL  POCT Glucose (CBG)  Result Value Ref Range   POC Glucose 102 (A) 70 - 99 mg/dl      Assessment & Plan:   Encounter Diagnoses  Name Primary?  . Constipation, unspecified constipation type Yes  . Cigarette nicotine dependence without complication   . Mood disorder (HCC)   . History of traumatic brain injury     -reviewed labs with pt -counseled on constipation and encouraged plenty of water and high fiber diet.  Gave reading information/handout -pt to continue with [facility] for his bipolar disorder and panic attacks -f/u 6 months.  RTO sooner prn

## 2016-11-26 ENCOUNTER — Ambulatory Visit: Payer: Self-pay | Admitting: Physician Assistant

## 2016-11-26 ENCOUNTER — Encounter: Payer: Self-pay | Admitting: Physician Assistant

## 2016-11-26 VITALS — BP 108/78 | HR 72 | Temp 98.2°F | Ht 69.5 in | Wt 225.0 lb

## 2016-11-26 DIAGNOSIS — E669 Obesity, unspecified: Secondary | ICD-10-CM

## 2016-11-26 DIAGNOSIS — F17210 Nicotine dependence, cigarettes, uncomplicated: Secondary | ICD-10-CM

## 2016-11-26 DIAGNOSIS — F39 Unspecified mood [affective] disorder: Secondary | ICD-10-CM

## 2016-11-26 DIAGNOSIS — Z6832 Body mass index (BMI) 32.0-32.9, adult: Secondary | ICD-10-CM

## 2016-11-26 NOTE — Progress Notes (Signed)
BP 108/78 (BP Location: Left Arm, Patient Position: Sitting, Cuff Size: Normal)   Pulse 72   Temp 98.2 F (36.8 C) (Other (Comment))   Ht 5' 9.5" (1.765 m)   Wt 225 lb (102.1 kg)   SpO2 97%   BMI 32.75 kg/m    Subjective:    Patient ID: Allen [name], male    DOB: 10/08/1986, 31 y.o.   MRN: [id number]  HPI: Allen [name] is a 31 y.o. male presenting on 11/26/2016 for Follow-up (states weight gain is r/t new med which is effective in treating bipolar disorder)   HPI  [facility] issues  Pt constipation is doing better  Relevant past medical, surgical, family and social history reviewed and updated as indicated. Interim medical history since our last visit reviewed. Allergies and medications reviewed and updated.   Current Outpatient Prescriptions:  .  acetaminophen (TYLENOL) 650 MG CR tablet, Take 1,300-1,950 mg by mouth every 8 (eight) hours as needed for pain., Disp: , Rfl:  .  ibuprofen (ADVIL,MOTRIN) 200 MG tablet, Take 400 mg by mouth every 6 (six) hours as needed for headache or moderate pain., Disp: , Rfl:  .  risperiDONE (RISPERDAL) 0.5 MG tablet, Take by mouth 2 (two) times daily., Disp: , Rfl:    Review of Systems  Constitutional: Positive for appetite change. Negative for chills, diaphoresis, fatigue, fever and unexpected weight change.  HENT: Positive for dental problem. Negative for congestion, drooling, ear pain, facial swelling, hearing loss, mouth sores, sneezing, sore throat, trouble swallowing and voice change.   Eyes: Negative for pain, discharge, redness, itching and visual disturbance.  Respiratory: Negative for cough, choking, shortness of breath and wheezing.   Cardiovascular: Negative for chest pain, palpitations and leg swelling.  Gastrointestinal: Negative for abdominal pain, blood in stool, constipation, diarrhea and vomiting.  Endocrine: Negative for cold intolerance, heat intolerance and polydipsia.  Genitourinary: Negative for  decreased urine volume, dysuria and hematuria.  Musculoskeletal: Positive for back pain. Negative for arthralgias and gait problem.  Skin: Negative for rash.  Allergic/Immunologic: Negative for environmental allergies.  Neurological: Negative for seizures, syncope, light-headedness and headaches.  Hematological: Negative for adenopathy.  Psychiatric/Behavioral: Negative for agitation, dysphoric mood and suicidal ideas. The patient is nervous/anxious.     Per HPI unless specifically indicated above     Objective:    BP 108/78 (BP Location: Left Arm, Patient Position: Sitting, Cuff Size: Normal)   Pulse 72   Temp 98.2 F (36.8 C) (Other (Comment))   Ht 5' 9.5" (1.765 m)   Wt 225 lb (102.1 kg)   SpO2 97%   BMI 32.75 kg/m   Wt Readings from Last 3 Encounters:  11/26/16 225 lb (102.1 kg)  05/26/16 180 lb 9.6 oz (81.9 kg)  04/27/16 179 lb 8 oz (81.4 kg)    Physical Exam  Constitutional: He is oriented to person, place, and time. He appears well-developed and well-nourished.  HENT:  Head: Normocephalic and atraumatic.  Neck: Neck supple.  Cardiovascular: Normal rate and regular rhythm.   Pulmonary/Chest: Effort normal and breath sounds normal. He has no wheezes.  Abdominal: Soft. Bowel sounds are normal. There is no hepatosplenomegaly. There is no tenderness.  Musculoskeletal: He exhibits no edema.  Lymphadenopathy:    He has no cervical adenopathy.  Neurological: He is alert and oriented to person, place, and time.  Skin: Skin is warm and dry.  Psychiatric: He has a normal mood and affect. His behavior is normal.  Vitals reviewed.   Results for orders placed or performed in visit on 04/27/16  TSH  Result Value Ref Range   TSH 1.38 0.40 - 4.50 mIU/L  HgB A1c  Result Value Ref Range   Hgb A1c MFr Bld 5.4 <5.7 %   Mean Plasma Glucose 108 mg/dL  COMPLETE METABOLIC PANEL WITH GFR  Result Value Ref Range   Sodium 142 135 - 146 mmol/L   Potassium 4.4 3.5 - 5.3 mmol/L    Chloride 102 98 - 110 mmol/L   CO2 27 20 - 31 mmol/L   Glucose, Bld 92 65 - 99 mg/dL   BUN 10 7 - 25 mg/dL   Creat 4.09 8.11 - 9.14 mg/dL   Total Bilirubin 1.0 0.2 - 1.2 mg/dL   Alkaline Phosphatase 56 40 - 115 U/L   AST 15 10 - 40 U/L   ALT 20 9 - 46 U/L   Total Protein 6.9 6.1 - 8.1 g/dL   Albumin 4.6 3.6 - 5.1 g/dL   Calcium 9.8 8.6 - 78.2 mg/dL   GFR, Est African American >89 >=60 mL/min   GFR, Est Non African American >89 >=60 mL/min  Lipid Profile  Result Value Ref Range   Cholesterol 185 125 - 200 mg/dL   Triglycerides 83 <956 mg/dL   HDL 48 >=21 mg/dL   Total CHOL/HDL Ratio 3.9 <=5.0 Ratio   VLDL 17 <30 mg/dL   LDL Cholesterol 308 <657 mg/dL  POCT Glucose (CBG)  Result Value Ref Range   POC Glucose 102 (A) 70 - 99 mg/dl      Assessment & Plan:    Encounter Diagnoses  Name Primary?  . Cigarette nicotine dependence without complication Yes  . Mood disorder (HCC)   . Class 1 obesity with body mass index (BMI) of 32.0 to 32.9 in adult, unspecified obesity type, unspecified whether serious comorbidity present     -Reviewed labs with pt -Counseled smoking cessation -follow up 1 year.  RTO sooner prn

## 2017-11-29 ENCOUNTER — Other Ambulatory Visit (HOSPITAL_COMMUNITY)
Admission: RE | Admit: 2017-11-29 | Discharge: 2017-11-29 | Disposition: A | Discharge status: Home / Self Care | Payer: Self-pay | Source: Ambulatory Visit | Attending: Physician Assistant | Admitting: Physician Assistant

## 2017-11-29 ENCOUNTER — Ambulatory Visit: Payer: Self-pay | Admitting: Physician Assistant

## 2017-11-29 ENCOUNTER — Encounter: Payer: Self-pay | Admitting: Physician Assistant

## 2017-11-29 VITALS — BP 159/90 | HR 55 | Temp 97.7°F | Wt 239.5 lb

## 2017-11-29 DIAGNOSIS — E669 Obesity, unspecified: Secondary | ICD-10-CM

## 2017-11-29 DIAGNOSIS — F39 Unspecified mood [affective] disorder: Secondary | ICD-10-CM

## 2017-11-29 DIAGNOSIS — F17210 Nicotine dependence, cigarettes, uncomplicated: Secondary | ICD-10-CM

## 2017-11-29 DIAGNOSIS — Z13220 Encounter for screening for lipoid disorders: Secondary | ICD-10-CM | POA: Insufficient documentation

## 2017-11-29 DIAGNOSIS — R030 Elevated blood-pressure reading, without diagnosis of hypertension: Secondary | ICD-10-CM

## 2017-11-29 LAB — LIPID PANEL
CHOL/HDL RATIO: 5.1 ratio
Cholesterol: 254 mg/dL — ABNORMAL HIGH (ref 0–200)
HDL: 50 mg/dL (ref >40)
LDL CALC: 187 mg/dL — AB (ref 0–99)
TRIGLYCERIDES: 85 mg/dL (ref <150)
VLDL: 17 mg/dL (ref 0–40)

## 2017-11-29 LAB — COMPREHENSIVE METABOLIC PANEL
ALT: 58 U/L (ref 17–63)
ANION GAP: 12 (ref 5–15)
AST: 33 U/L (ref 15–41)
Albumin: 5.1 g/dL — ABNORMAL HIGH (ref 3.5–5.0)
Alkaline Phosphatase: 73 U/L (ref 38–126)
BUN: 9 mg/dL (ref 6–20)
CALCIUM: 9.7 mg/dL (ref 8.9–10.3)
CO2: 23 mmol/L (ref 22–32)
Chloride: 103 mmol/L (ref 101–111)
Creatinine, Ser: 0.92 mg/dL (ref 0.61–1.24)
GFR calc non Af Amer: >60 mL/min (ref >60)
Glucose, Bld: 107 mg/dL — ABNORMAL HIGH (ref 65–99)
POTASSIUM: 3.9 mmol/L (ref 3.5–5.1)
SODIUM: 138 mmol/L (ref 135–145)
Total Bilirubin: 0.8 mg/dL (ref 0.3–1.2)
Total Protein: 8.6 g/dL — ABNORMAL HIGH (ref 6.5–8.1)

## 2017-11-29 NOTE — Progress Notes (Signed)
BP (!) 159/90 (BP Location: Right Arm, Patient Position: Sitting, Cuff Size: Normal)   Pulse (!) 55   Temp 97.7 F (36.5 C)   Wt 239 lb 8 oz (108.6 kg)   SpO2 96%   BMI 34.86 kg/m    Subjective:    Patient ID: Allen [name], male    DOB: 07/26/1986, 32 y.o.   MRN: [id number]  HPI: Allen [name] is a 32 y.o. male presenting on 11/29/2017 for Follow-up   HPI   Pt feeling okay.  He thinks his bp is up today because he was up all night due to drinking coffee late evening.  [facility] is [facility].   He says he is doing fine.  He knows he has been putting on weight.  He Says it is because of the psych meds  Relevant past medical, surgical, family and social history reviewed and updated as indicated. Interim medical history since our last visit reviewed. Allergies and medications reviewed and updated.   Current Outpatient Medications:  .  acetaminophen (TYLENOL) 650 MG CR tablet, Take 1,300-1,950 mg by mouth every 8 (eight) hours as needed for pain., Disp: , Rfl:  .  escitalopram (LEXAPRO) 20 MG tablet, Take 20 mg by mouth daily., Disp: , Rfl:  .  risperiDONE (RISPERDAL) 0.5 MG tablet, Take 1 mg by mouth 2 (two) times daily. , Disp: , Rfl:    Review of Systems  Constitutional: Negative for appetite change, chills, diaphoresis, fatigue, fever and unexpected weight change.  HENT: Negative for congestion, dental problem, drooling, ear pain, facial swelling, hearing loss, mouth sores, sneezing, sore throat, trouble swallowing and voice change.   Eyes: Negative for pain, discharge, redness, itching and visual disturbance.  Respiratory: Negative for cough, choking, shortness of breath and wheezing.   Cardiovascular: Negative for chest pain, palpitations and leg swelling.  Gastrointestinal: Negative for abdominal pain, blood in stool, constipation, diarrhea and vomiting.  Endocrine: Negative for cold intolerance, heat intolerance and polydipsia.  Genitourinary: Negative for  decreased urine volume, dysuria and hematuria.  Musculoskeletal: Positive for back pain. Negative for arthralgias and gait problem.  Skin: Negative for rash.  Allergic/Immunologic: Negative for environmental allergies.  Neurological: Positive for headaches. Negative for seizures, syncope and light-headedness.  Hematological: Negative for adenopathy.  Psychiatric/Behavioral: Positive for dysphoric mood. Negative for agitation and suicidal ideas. The patient is nervous/anxious.     Per HPI unless specifically indicated above     Objective:    BP (!) 159/90 (BP Location: Right Arm, Patient Position: Sitting, Cuff Size: Normal)   Pulse (!) 55   Temp 97.7 F (36.5 C)   Wt 239 lb 8 oz (108.6 kg)   SpO2 96%   BMI 34.86 kg/m   Wt Readings from Last 3 Encounters:  11/29/17 239 lb 8 oz (108.6 kg)  11/26/16 225 lb (102.1 kg)  05/26/16 180 lb 9.6 oz (81.9 kg)    Physical Exam  Constitutional: He is oriented to person, place, and time. He appears well-developed and well-nourished.  HENT:  Head: Normocephalic and atraumatic.  Neck: Neck supple.  Cardiovascular: Normal rate and regular rhythm.  Pulmonary/Chest: Effort normal and breath sounds normal. He has no wheezes.  Abdominal: Soft. Bowel sounds are normal. There is no hepatosplenomegaly. There is no tenderness.  Musculoskeletal: He exhibits no edema.  Lymphadenopathy:    He has no cervical adenopathy.  Neurological: He is alert and oriented to person, place, and time.  Skin: Skin is warm and dry.  Psychiatric: He has a normal mood and affect. His behavior is normal.  Vitals reviewed.       Assessment & Plan:    Encounter Diagnoses  Name Primary?  . Elevated blood pressure reading Yes  . Cigarette nicotine dependence without complication   . Mood disorder (HCC)   . Screening cholesterol level   . Obesity, unspecified classification, unspecified obesity type, unspecified whether serious comorbidity present     -Recheck  lipids -counseled pt on weight management with diet and regular exercise -will follow up in one month to recheck bp.  RTO sooner prn

## 2017-12-27 ENCOUNTER — Ambulatory Visit: Payer: Self-pay | Admitting: Physician Assistant

## 2017-12-27 ENCOUNTER — Encounter: Payer: Self-pay | Admitting: Physician Assistant

## 2017-12-27 VITALS — BP 127/83 | HR 64 | Temp 97.9°F | Wt 241.5 lb

## 2017-12-27 DIAGNOSIS — F39 Unspecified mood [affective] disorder: Secondary | ICD-10-CM

## 2017-12-27 DIAGNOSIS — E785 Hyperlipidemia, unspecified: Secondary | ICD-10-CM

## 2017-12-27 DIAGNOSIS — E669 Obesity, unspecified: Secondary | ICD-10-CM

## 2017-12-27 DIAGNOSIS — F17210 Nicotine dependence, cigarettes, uncomplicated: Secondary | ICD-10-CM

## 2017-12-27 NOTE — Progress Notes (Signed)
BP 127/83 (BP Location: Right Arm, Patient Position: Sitting, Cuff Size: Normal)   Pulse 64   Temp 97.9 F (36.6 C)   Wt 241 lb 8 oz (109.5 kg)   SpO2 97%   BMI 35.15 kg/m    Subjective:    Patient ID: Allen [name], male    DOB: 12/07/1985, 32 y.o.   MRN: [id number]  HPI: Allen [name] is a 32 y.o. male presenting on 12/27/2017 for Hypertension   HPI   Pt is doing fine today and is without complaint.  Relevant past medical, surgical, family and social history reviewed and updated as indicated. Interim medical history since our last visit reviewed. Allergies and medications reviewed and updated.   Current Outpatient Medications:  .  acetaminophen (TYLENOL) 650 MG CR tablet, Take 1,300-1,950 mg by mouth every 8 (eight) hours as needed for pain., Disp: , Rfl:  .  escitalopram (LEXAPRO) 20 MG tablet, Take 20 mg by mouth daily., Disp: , Rfl:  .  risperiDONE (RISPERDAL) 0.5 MG tablet, Take 1 mg by mouth 2 (two) times daily. , Disp: , Rfl:    Review of Systems  Constitutional: Negative for appetite change, chills, diaphoresis, fatigue, fever and unexpected weight change.  HENT: Negative for congestion, dental problem, drooling, ear pain, facial swelling, hearing loss, mouth sores, sneezing, sore throat, trouble swallowing and voice change.   Eyes: Negative for pain, discharge, redness, itching and visual disturbance.  Respiratory: Negative for cough, choking, shortness of breath and wheezing.   Cardiovascular: Negative for chest pain, palpitations and leg swelling.  Gastrointestinal: Negative for abdominal pain, blood in stool, constipation, diarrhea and vomiting.  Endocrine: Negative for cold intolerance, heat intolerance and polydipsia.  Genitourinary: Negative for decreased urine volume, dysuria and hematuria.  Musculoskeletal: Negative for arthralgias, back pain and gait problem.  Skin: Negative for rash.  Allergic/Immunologic: Negative for environmental allergies.   Neurological: Negative for seizures, syncope, light-headedness and headaches.  Hematological: Negative for adenopathy.  Psychiatric/Behavioral: Negative for agitation, dysphoric mood and suicidal ideas. The patient is not nervous/anxious.     Per HPI unless specifically indicated above     Objective:    BP 127/83 (BP Location: Right Arm, Patient Position: Sitting, Cuff Size: Normal)   Pulse 64   Temp 97.9 F (36.6 C)   Wt 241 lb 8 oz (109.5 kg)   SpO2 97%   BMI 35.15 kg/m   Wt Readings from Last 3 Encounters:  12/27/17 241 lb 8 oz (109.5 kg)  11/29/17 239 lb 8 oz (108.6 kg)  11/26/16 225 lb (102.1 kg)    Physical Exam  Constitutional: He is oriented to person, place, and time. He appears well-developed and well-nourished.  HENT:  Head: Normocephalic and atraumatic.  Neck: Neck supple.  Cardiovascular: Normal rate and regular rhythm.  Pulmonary/Chest: Effort normal and breath sounds normal. He has no wheezes.  Abdominal: Soft. Bowel sounds are normal. There is no hepatosplenomegaly. There is no tenderness.  Musculoskeletal: He exhibits no edema.  Lymphadenopathy:    He has no cervical adenopathy.  Neurological: He is alert and oriented to person, place, and time.  Skin: Skin is warm and dry.  Psychiatric: He has a normal mood and affect. His behavior is normal.  Vitals reviewed.   Results for orders placed or performed during the hospital encounter of 11/29/17  Comprehensive metabolic panel  Result Value Ref Range   Sodium 138 135 - 145 mmol/L   Potassium 3.9 3.5 - 5.1 mmol/L   Chloride  103 101 - 111 mmol/L   CO2 23 22 - 32 mmol/L   Glucose, Bld 107 (H) 65 - 99 mg/dL   BUN 9 6 - 20 mg/dL   Creatinine, Ser 1.61 0.61 - 1.24 mg/dL   Calcium 9.7 8.9 - 09.6 mg/dL   Total Protein 8.6 (H) 6.5 - 8.1 g/dL   Albumin 5.1 (H) 3.5 - 5.0 g/dL   AST 33 15 - 41 U/L   ALT 58 17 - 63 U/L   Alkaline Phosphatase 73 38 - 126 U/L   Total Bilirubin 0.8 0.3 - 1.2 mg/dL   GFR calc  non Af Amer >60 >60 mL/min   GFR calc Af Amer >60 >60 mL/min   Anion gap 12 5 - 15  Lipid panel  Result Value Ref Range   Cholesterol 254 (H) 0 - 200 mg/dL   Triglycerides 85 <045 mg/dL   HDL 50 >40 mg/dL   Total CHOL/HDL Ratio 5.1 RATIO   VLDL 17 0 - 40 mg/dL   LDL Cholesterol 981 (H) 0 - 99 mg/dL      Assessment & Plan:    Encounter Diagnoses  Name Primary?  . Hyperlipidemia, unspecified hyperlipidemia type Yes  . Cigarette nicotine dependence without complication   . Mood disorder (HCC)   . Obesity, unspecified classification, unspecified obesity type, unspecified whether serious comorbidity present     -reviewed labs with pt -discussed statins for lipids but pt would like to try to get lipids down with diet and exercise.  Counseled pt on lowfat diet.  Also discussed stopping smoking would help lipids -pt to continue with [facility] for [facility] issues -pt to follow up here 3 months to recheck lipids.  RTO sooner prn

## 2017-12-27 NOTE — Patient Instructions (Signed)
Fat and Cholesterol Restricted Diet High levels of fat and cholesterol in your blood may lead to various health problems, such as diseases of the heart, blood vessels, gallbladder, liver, and pancreas. Fats are concentrated sources of energy that come in various forms. Certain types of fat, including saturated fat, may be harmful in excess. Cholesterol is a substance needed by your body in small amounts. Your body makes all the cholesterol it needs. Excess cholesterol comes from the food you eat. When you have high levels of cholesterol and saturated fat in your blood, health problems can develop because the excess fat and cholesterol will gather along the walls of your blood vessels, causing them to narrow. Choosing the right foods will help you control your intake of fat and cholesterol. This will help keep the levels of these substances in your blood within normal limits and reduce your risk of disease. What is my plan? Your health care provider recommends that you:  Limit your fat intake to ______% or less of your total calories per day.  Limit the amount of cholesterol in your diet to less than _________mg per day.  Eat 20-30 grams of fiber each day.  What types of fat should I choose?  Choose healthy fats more often. Choose monounsaturated and polyunsaturated fats, such as olive and canola oil, flaxseeds, walnuts, almonds, and seeds.  Eat more omega-3 fats. Good choices include salmon, mackerel, sardines, tuna, flaxseed oil, and ground flaxseeds. Aim to eat fish at least two times a week.  Limit saturated fats. Saturated fats are primarily found in animal products, such as meats, butter, and cream. Plant sources of saturated fats include palm oil, palm kernel oil, and coconut oil.  Avoid foods with partially hydrogenated oils in them. These contain trans fats. Examples of foods that contain trans fats are stick margarine, some tub margarines, cookies, crackers, and other baked goods. What  general guidelines do I need to follow? These guidelines for healthy eating will help you control your intake of fat and cholesterol:  Check food labels carefully to identify foods with trans fats or high amounts of saturated fat.  Fill one half of your plate with vegetables and green salads.  Fill one fourth of your plate with whole grains. Look for the word "whole" as the first word in the ingredient list.  Fill one fourth of your plate with lean protein foods.  Limit fruit to two servings a day. Choose fruit instead of juice.  Eat more foods that contain fiber, such as apples, broccoli, carrots, beans, peas, and barley.  Eat more home-cooked food and less restaurant, buffet, and fast food.  Limit or avoid alcohol.  Limit foods high in starch and sugar.  Limit fried foods.  Cook foods using methods other than frying. Baking, boiling, grilling, and broiling are all great options.  Lose weight if you are overweight. Losing just 5-10% of your initial body weight can help your overall health and prevent diseases such as diabetes and heart disease.  What foods can I eat? Grains  Whole grains, such as whole wheat or whole grain breads, crackers, cereals, and pasta. Unsweetened oatmeal, bulgur, barley, quinoa, or brown rice. Corn or whole wheat flour tortillas. Vegetables  Fresh or frozen vegetables (raw, steamed, roasted, or grilled). Green salads. Fruits  All fresh, canned (in natural juice), or frozen fruits. Meats and other protein foods  Ground beef (85% or leaner), grass-fed beef, or beef trimmed of fat. Skinless chicken or turkey. Ground chicken or turkey.   Pork trimmed of fat. All fish and seafood. Eggs. Dried beans, peas, or lentils. Unsalted nuts or seeds. Unsalted canned or dry beans. Dairy  Low-fat dairy products, such as skim or 1% milk, 2% or reduced-fat cheeses, low-fat ricotta or cottage cheese, or plain low-fat yo Fats and oils  Tub margarines without trans  fats. Light or reduced-fat mayonnaise and salad dressings. Avocado. Olive, canola, sesame, or safflower oils. Natural peanut or almond butter (choose ones without added sugar and oil). The items listed above may not be a complete list of recommended foods or beverages. Contact your dietitian for more options. Foods to avoid Grains  White bread. White pasta. White rice. Cornbread. Bagels, pastries, and croissants. Crackers that contain trans fat. Vegetables  White potatoes. Corn. Creamed or fried vegetables. Vegetables in a cheese sauce. Fruits  Dried fruits. Canned fruit in light or heavy syrup. Fruit juice. Meats and other protein foods  Fatty cuts of meat. Ribs, chicken wings, bacon, sausage, bologna, salami, chitterlings, fatback, hot dogs, bratwurst, and packaged luncheon meats. Liver and organ meats. Dairy  Whole or 2% milk, cream, half-and-half, and cream cheese. Whole milk cheeses. Whole-fat or sweetened yogurt. Full-fat cheeses. Nondairy creamers and whipped toppings. Processed cheese, cheese spreads, or cheese curds. Beverages  Alcohol. Sweetened drinks (such as sodas, lemonade, and fruit drinks or punches). Fats and oils  Butter, stick margarine, lard, shortening, ghee, or bacon fat. Coconut, palm kernel, or palm oils. Sweets and desserts  Corn syrup, sugars, honey, and molasses. Candy. Jam and jelly. Syrup. Sweetened cereals. Cookies, pies, cakes, donuts, muffins, and ice cream. The items listed above may not be a complete list of foods and beverages to avoid. Contact your dietitian for more information. This information is not intended to replace advice given to you by your health care provider. Make sure you discuss any questions you have with your health care provider. Document Released: 10/12/2005 Document Revised: 11/02/2014 Document Reviewed: 01/10/2014 Elsevier Interactive Patient Education  2018 [employer]  

## 2017-12-28 ENCOUNTER — Emergency Department (HOSPITAL_BASED_OUTPATIENT_CLINIC_OR_DEPARTMENT_OTHER)
Admission: EM | Admit: 2017-12-28 | Discharge: 2018-01-24 | Disposition: E | Payer: Self-pay | Attending: Emergency Medicine | Admitting: Emergency Medicine

## 2017-12-28 ENCOUNTER — Encounter (HOSPITAL_BASED_OUTPATIENT_CLINIC_OR_DEPARTMENT_OTHER): Payer: Self-pay | Admitting: *Deleted

## 2017-12-28 DIAGNOSIS — Z87820 Personal history of traumatic brain injury: Secondary | ICD-10-CM | POA: Insufficient documentation

## 2017-12-28 DIAGNOSIS — Z79899 Other long term (current) drug therapy: Secondary | ICD-10-CM | POA: Insufficient documentation

## 2017-12-28 DIAGNOSIS — F17210 Nicotine dependence, cigarettes, uncomplicated: Secondary | ICD-10-CM | POA: Insufficient documentation

## 2017-12-28 DIAGNOSIS — I469 Cardiac arrest, cause unspecified: Secondary | ICD-10-CM | POA: Insufficient documentation

## 2017-12-28 MED ORDER — NALOXONE HCL 2 MG/2ML IJ SOSY
PREFILLED_SYRINGE | INTRAMUSCULAR | Status: AC | PRN
  Administered 2017-12-28 (×2): 2 mg via INTRAVENOUS

## 2017-12-28 MED ORDER — DEXTROSE 50 % IV SOLN
INTRAVENOUS | Status: AC | PRN
  Administered 2017-12-28: 1 via INTRAVENOUS

## 2017-12-28 MED ORDER — SODIUM BICARBONATE 8.4 % IV SOLN
INTRAVENOUS | Status: AC | PRN
  Administered 2017-12-28: 50 meq via INTRAVENOUS

## 2017-12-28 MED ORDER — EPINEPHRINE PF 1 MG/10ML IJ SOSY
PREFILLED_SYRINGE | INTRAMUSCULAR | Status: AC | PRN
  Administered 2017-12-28: 1 mg via INTRAVENOUS

## 2017-12-28 MED ORDER — EPINEPHRINE PF 1 MG/10ML IJ SOSY
PREFILLED_SYRINGE | INTRAMUSCULAR | Status: AC | PRN
  Administered 2017-12-28: 1 via INTRAVENOUS

## 2018-01-24 NOTE — Code Documentation (Signed)
Patient time of death occurred at 121023.

## 2018-01-24 NOTE — ED Notes (Addendum)
Pt body released to [facility] Per request of medical examiner Fenton FoyDiane Merritt.

## 2018-01-24 NOTE — ED Notes (Signed)
ER staff called to entrance to assist with getting pt out of vehicle. Pt noted on floorboard of passenger seat, unresponsive to voice or tactile stim. Dr. Juleen ChinaKohut present, pt placed onto stretcher x 4 staff total lift/assist, pt noted cyanotic to face, no respirations, pulseless, cpr initiated. Madaline GuthrieFernando, EMT straddles pt while taken to trauma room by staff. Pt placed on monitor and Zoll, asystole noted. Compressions resumed, pt intubated by dr. Juleen ChinaKohut, I/O placed to right le.
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
## 2018-01-24 NOTE — Code Documentation (Signed)
Organ procurement team notified. Hurshel Partyim Shore, CDS. REFERRAL#: [id number]

## 2018-01-24 DEATH — deceased

## 2018-03-29 ENCOUNTER — Ambulatory Visit: Payer: Self-pay | Admitting: Physician Assistant

## 2018-05-03 IMAGING — CR DG CHEST 2V
2 series · 2 of 2 positions shown · non-contrast
Comparison: None available at the time of this dictation.

CLINICAL DATA: Chest pain for 2 months.

EXAM:
CHEST  2 VIEW

[w chest pa]
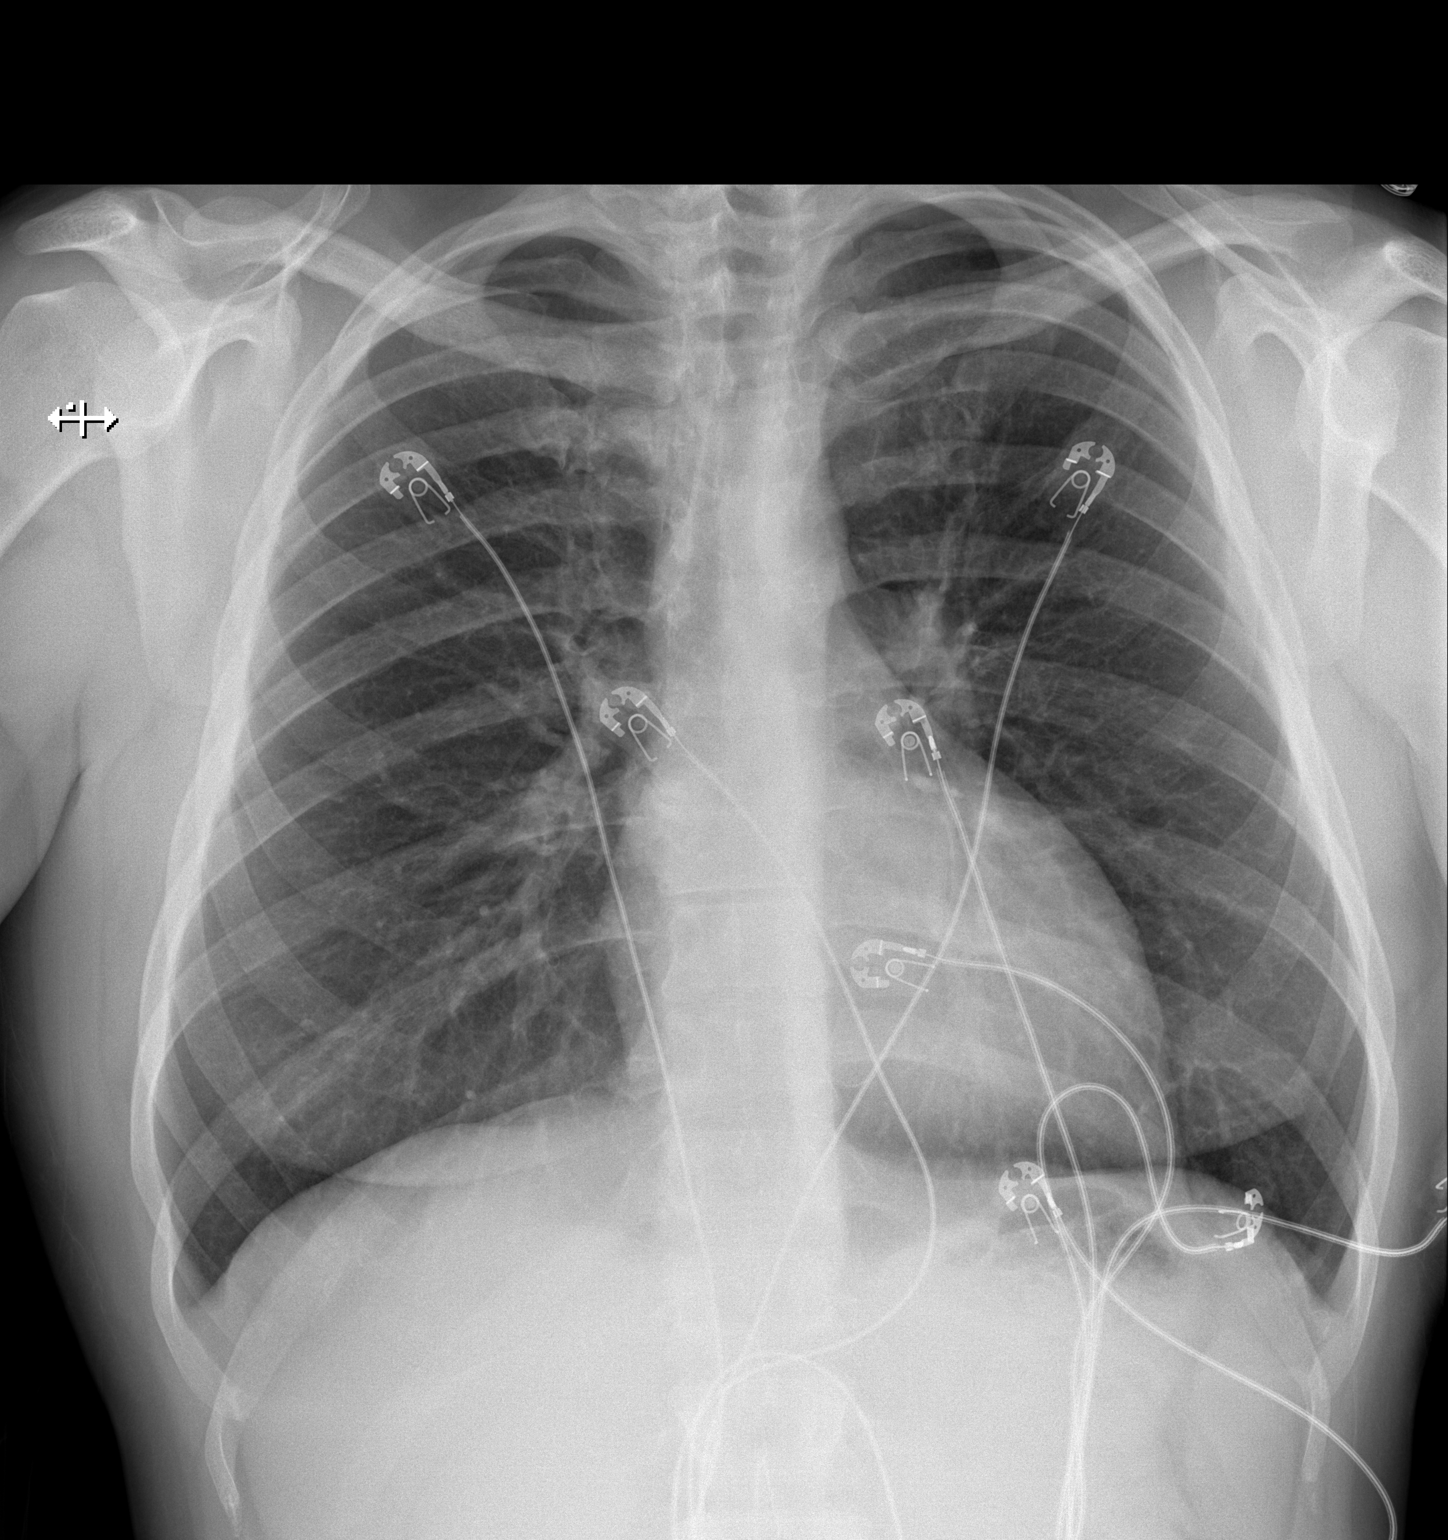

[w chest lat]
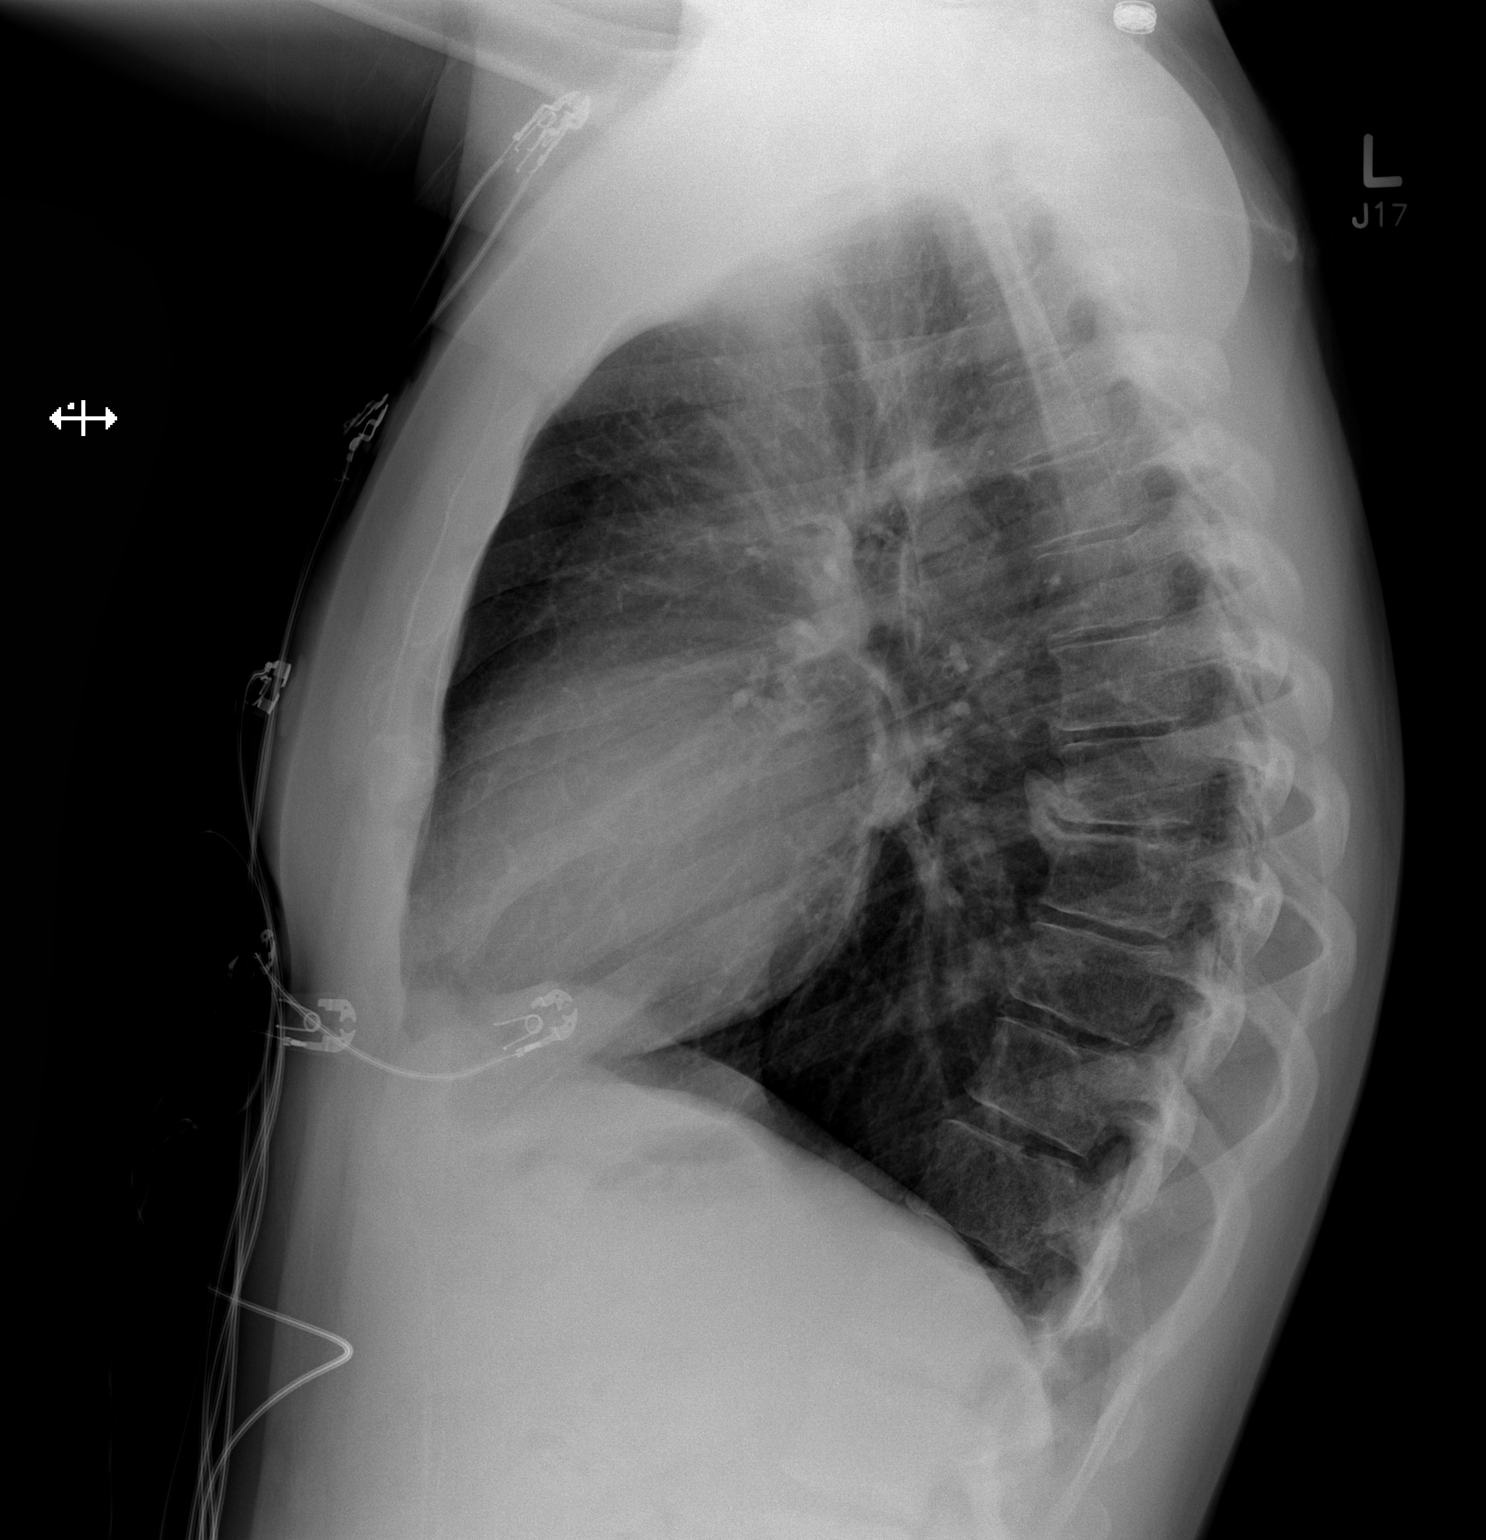

[2 of 2 positions shown; findings below may reference images not displayed]

FINDINGS: Cardiomediastinal silhouette is normal. Mediastinal contours appear
intact.

There is no evidence of focal airspace consolidation, pleural
effusion or pneumothorax.

Osseous structures are without acute abnormality. Osteoarthritic
changes of the thoracic spine and T8-T9 with formation of large
anterior osteophyte are noted. Soft tissues are grossly normal.
IMPRESSION: No active cardiopulmonary disease.

Osteoarthritic changes at T8-T9.
